# Patient Record
Sex: Female | Born: 1995 | Race: White | Hispanic: No | Marital: Single | Smoking: Never smoker
Health system: Southern US, Community
[De-identification: ages and names within clinical notes are randomized; demographics above are authoritative.]

## PROBLEM LIST (undated history)

## (undated) DIAGNOSIS — D241 Benign neoplasm of right breast: Secondary | ICD-10-CM

## (undated) DIAGNOSIS — F429 Obsessive-compulsive disorder, unspecified: Secondary | ICD-10-CM

## (undated) DIAGNOSIS — F32A Depression, unspecified: Secondary | ICD-10-CM

## (undated) DIAGNOSIS — L309 Dermatitis, unspecified: Secondary | ICD-10-CM

## (undated) DIAGNOSIS — F419 Anxiety disorder, unspecified: Secondary | ICD-10-CM

## (undated) HISTORY — PX: WISDOM TOOTH EXTRACTION: SHX21

---
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
## 2017-03-04 ENCOUNTER — Other Ambulatory Visit: Payer: Self-pay | Admitting: Internal Medicine

## 2017-03-04 DIAGNOSIS — N6310 Unspecified lump in the right breast, unspecified quadrant: Secondary | ICD-10-CM

## 2017-03-10 ENCOUNTER — Other Ambulatory Visit: Payer: Self-pay | Admitting: Internal Medicine

## 2017-03-10 ENCOUNTER — Ambulatory Visit
Admission: RE | Admit: 2017-03-10 | Discharge: 2017-03-10 | Disposition: A | Discharge status: Home / Self Care | Payer: 59 | Source: Ambulatory Visit | Attending: Internal Medicine | Admitting: Internal Medicine

## 2017-03-10 DIAGNOSIS — N6310 Unspecified lump in the right breast, unspecified quadrant: Secondary | ICD-10-CM

## 2017-07-21 ENCOUNTER — Other Ambulatory Visit: Payer: Self-pay | Admitting: Internal Medicine

## 2017-07-21 ENCOUNTER — Ambulatory Visit
Admission: RE | Admit: 2017-07-21 | Discharge: 2017-07-21 | Disposition: A | Discharge status: Home / Self Care | Payer: 59 | Source: Ambulatory Visit | Attending: Internal Medicine | Admitting: Internal Medicine

## 2017-07-21 DIAGNOSIS — R059 Cough, unspecified: Secondary | ICD-10-CM

## 2017-07-21 DIAGNOSIS — R05 Cough: Secondary | ICD-10-CM

## 2018-01-03 ENCOUNTER — Other Ambulatory Visit: Payer: Self-pay | Admitting: Internal Medicine

## 2018-01-03 DIAGNOSIS — N6310 Unspecified lump in the right breast, unspecified quadrant: Secondary | ICD-10-CM

## 2018-01-17 ENCOUNTER — Other Ambulatory Visit: Payer: Self-pay | Admitting: Internal Medicine

## 2018-01-17 ENCOUNTER — Ambulatory Visit
Admission: RE | Admit: 2018-01-17 | Discharge: 2018-01-17 | Disposition: A | Discharge status: Home / Self Care | Payer: 59 | Source: Ambulatory Visit | Attending: Internal Medicine | Admitting: Internal Medicine

## 2018-01-17 DIAGNOSIS — N6310 Unspecified lump in the right breast, unspecified quadrant: Secondary | ICD-10-CM

## 2018-01-24 ENCOUNTER — Ambulatory Visit
Admission: RE | Admit: 2018-01-24 | Discharge: 2018-01-24 | Disposition: A | Discharge status: Home / Self Care | Payer: 59 | Source: Ambulatory Visit | Attending: Internal Medicine | Admitting: Internal Medicine

## 2018-01-24 DIAGNOSIS — N6310 Unspecified lump in the right breast, unspecified quadrant: Secondary | ICD-10-CM

## 2018-04-25 IMAGING — US ULTRASOUND RIGHT BREAST LIMITED
1 series · 5 of 5 positions shown · non-contrast
Comparison: Baseline evaluation

CLINICAL DATA: Palpable abnormality in the lower inner quadrant of
the right breast, 1st noted 1 month ago.

EXAM:
ULTRASOUND OF THE RIGHT BREAST

[Series 1: ultrasound right breast limited · 0.06mm/px · 5 of 5 slices shown]
[im 1/5]
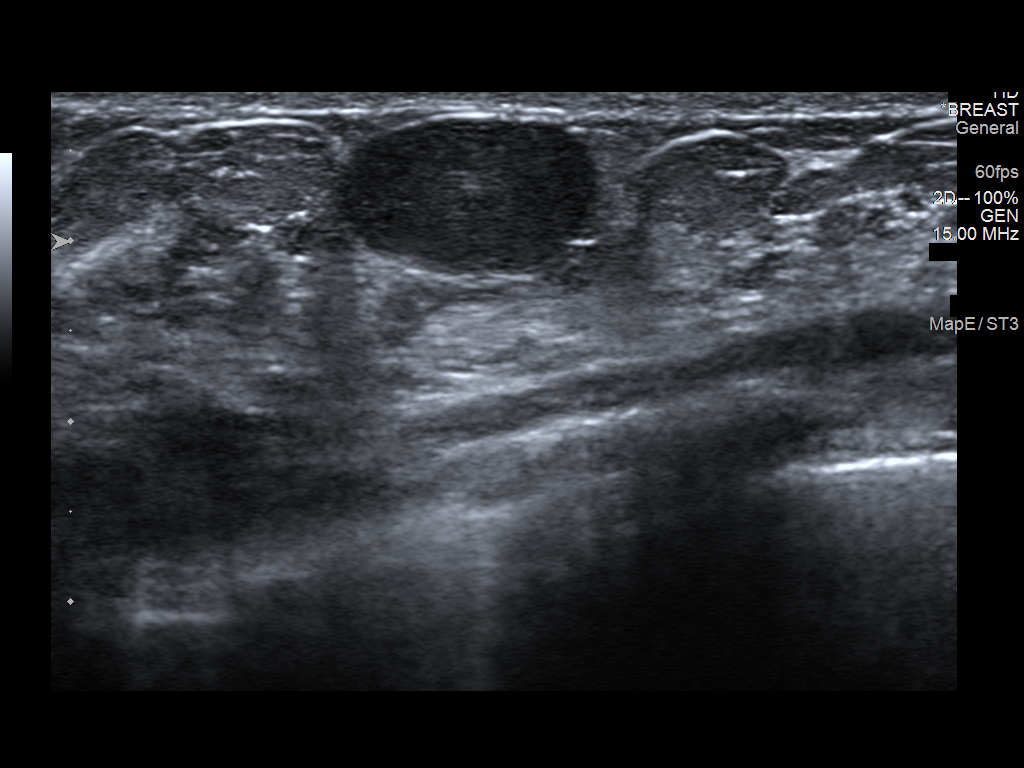
[im 2/5]
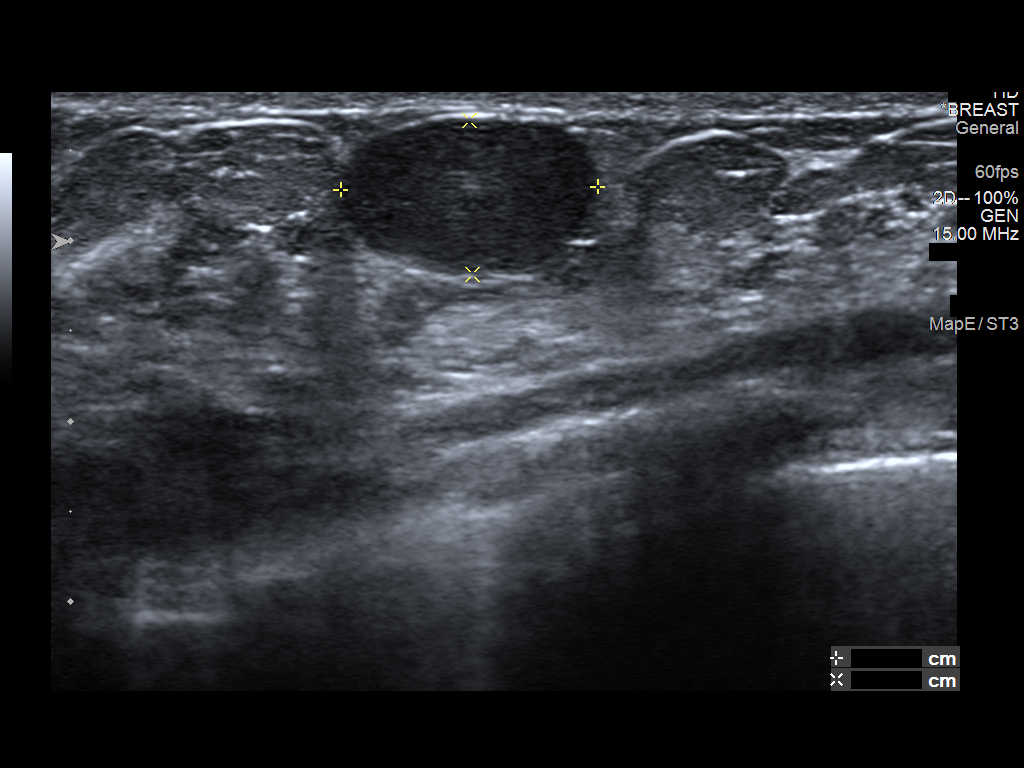
[im 3/5]
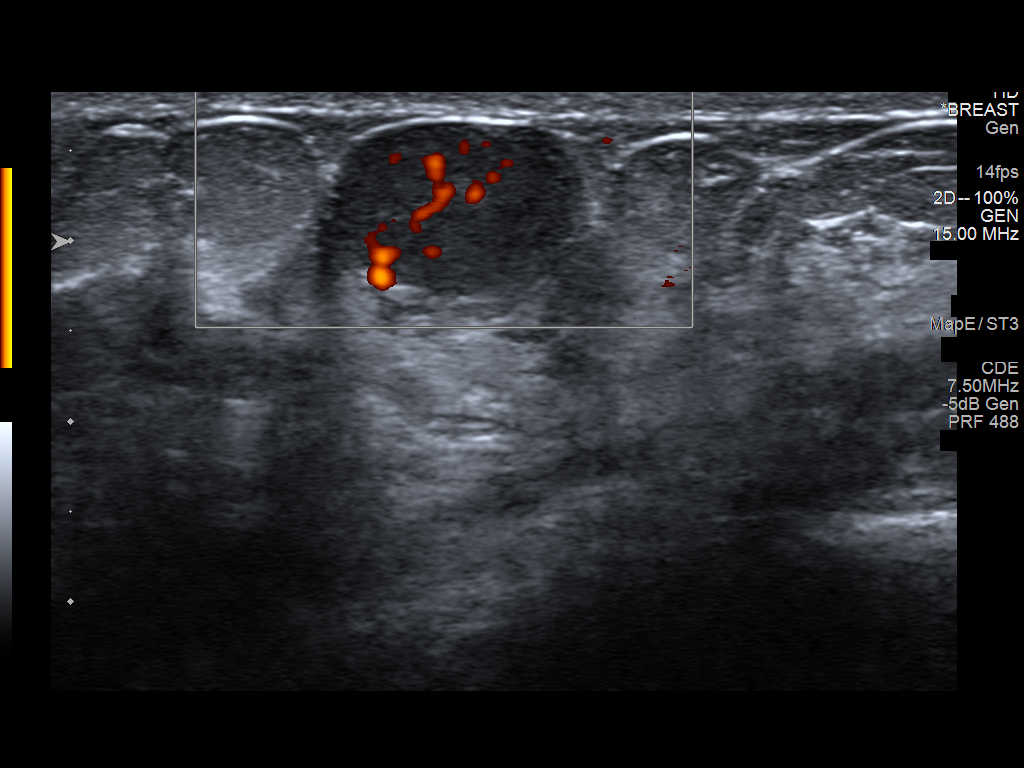
[im 4/5]
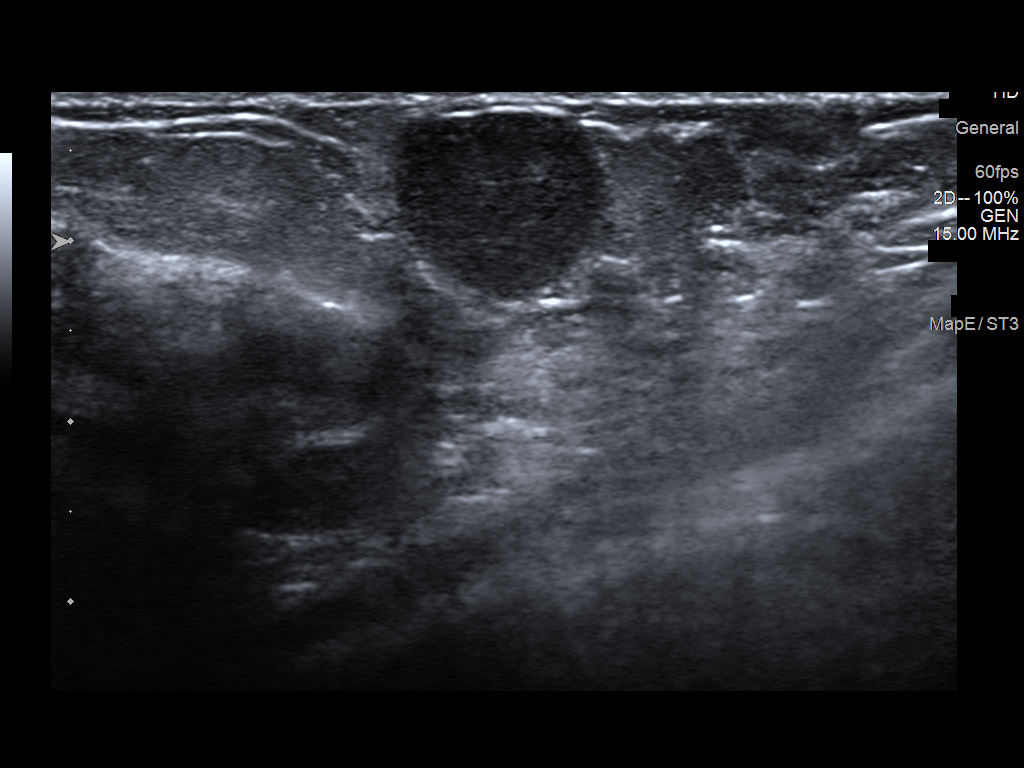
[im 5/5]
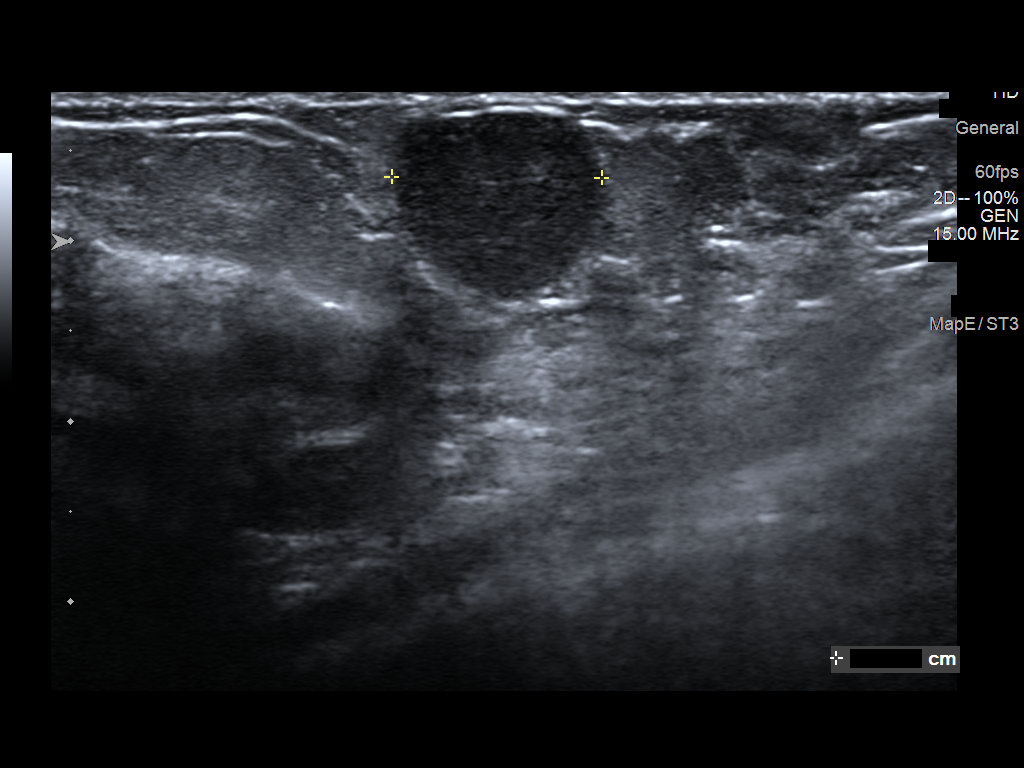

[5 of 5 positions shown; findings below may reference images not displayed]

FINDINGS: On physical exam, I palpate a rounded mobile mass in the lower inner
quadrant of the right breast.

Targeted ultrasound is performed, showing an oval parallel
circumscribed hypoechoic mass with posterior acoustic enhancement in
the 5 o'clock location of the right breast 4 centimeters from the
nipple. Mass is 1.4 x 0.9 x 1.2 centimeters and is associated
internal blood flow.
IMPRESSION: Findings are consistent with benign fibroadenoma. We discussed
management options including excision, biopsy, and close follow-up.
Imaging followup is recommended at 6, 12, and 24 months to assess
stability. The patient concurs with this plan.

RECOMMENDATION:
Right breast ultrasound is recommended in 6 months.

I have discussed the findings and recommendations with the patient.
Results were also provided in writing at the conclusion of the
visit. If applicable, a reminder letter will be sent to the patient
regarding the next appointment.

BI-RADS CATEGORY  3: Probably benign.

## 2018-10-14 ENCOUNTER — Other Ambulatory Visit: Payer: Self-pay

## 2018-10-14 DIAGNOSIS — Z20822 Contact with and (suspected) exposure to covid-19: Secondary | ICD-10-CM

## 2018-10-16 LAB — NOVEL CORONAVIRUS, NAA: SARS-CoV-2, NAA: NOT DETECTED

## 2019-03-04 IMAGING — US ULTRASOUND RIGHT BREAST LIMITED
1 series · 7 of 7 positions shown · non-contrast
Comparison: 03/10/2017

CLINICAL DATA: First six-month follow-up for probably benign mass
in the RIGHT breast. The patient believes that mass may be a little
larger.

EXAM:
ULTRASOUND OF THE RIGHT BREAST

[Series 1: ultrasound right breast limited · 0.06mm/px · 7 of 7 slices shown]
[im 1/7]
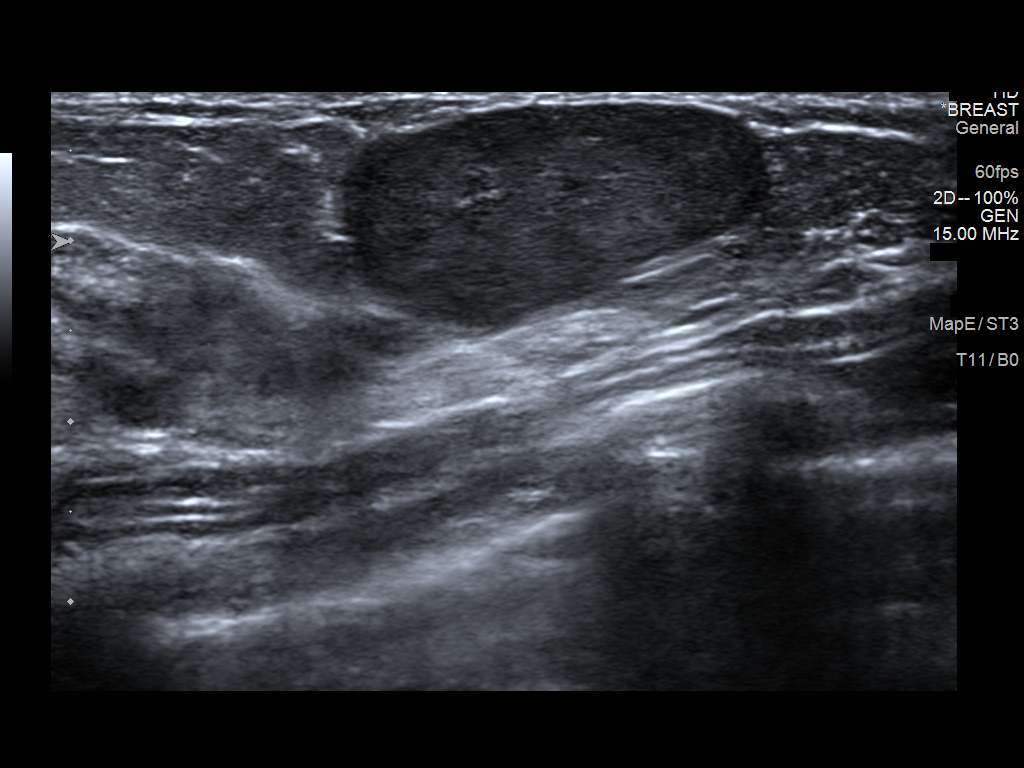
[im 2/7]
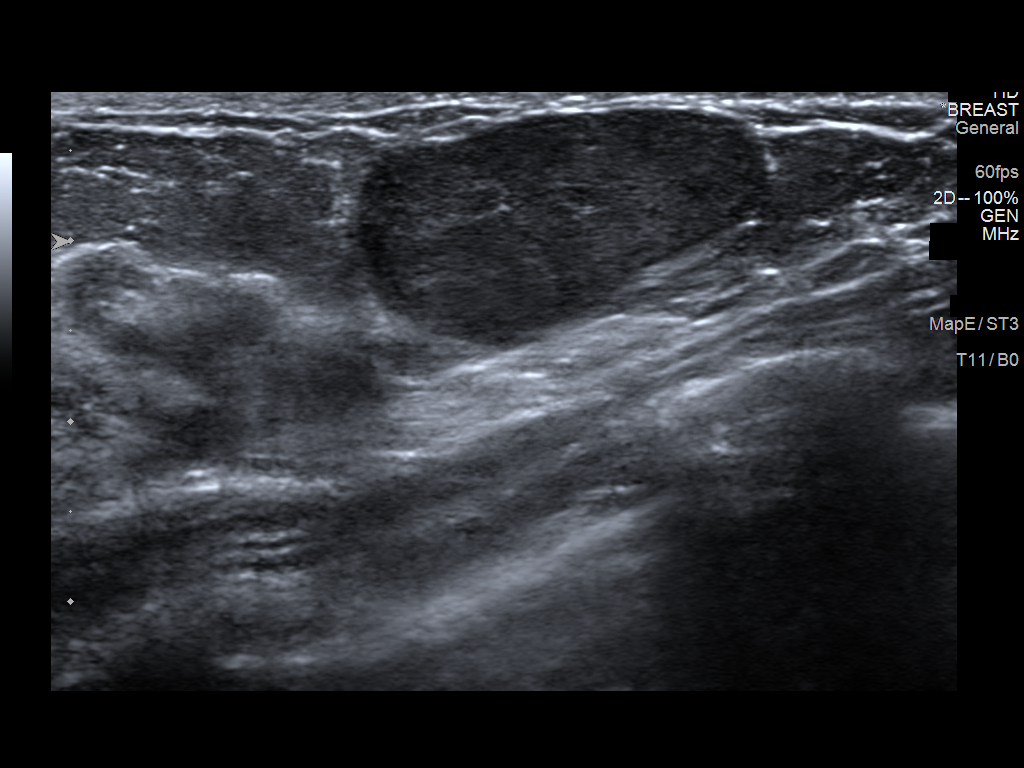
[im 3/7]
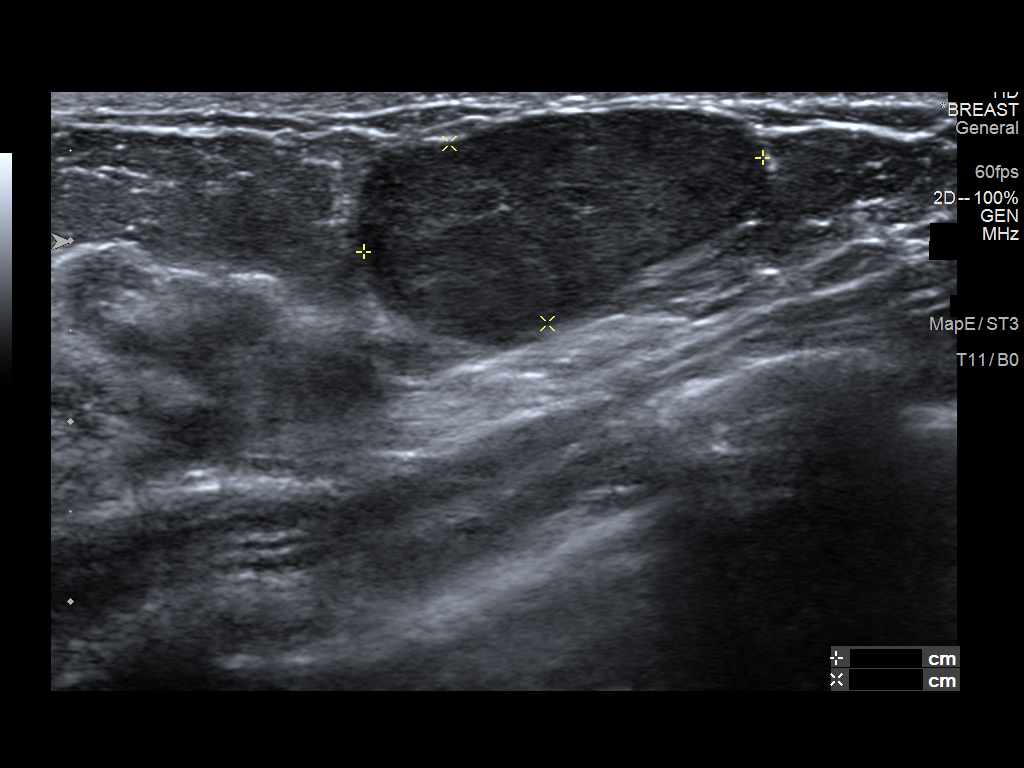
[im 4/7]
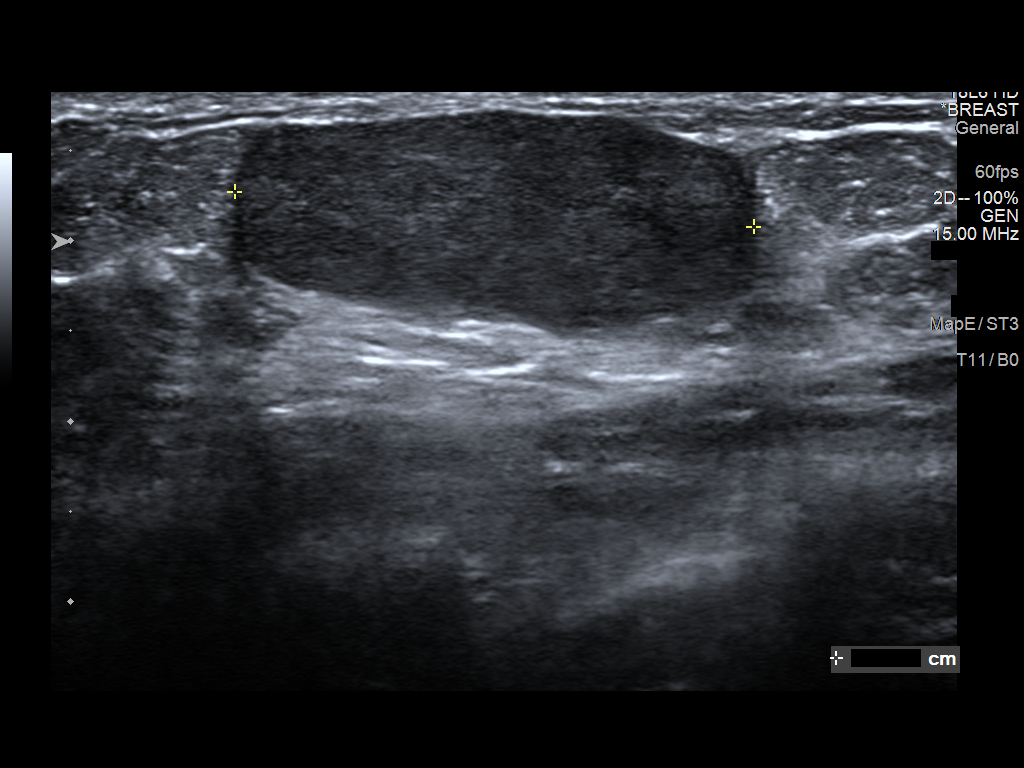
[im 5/7]
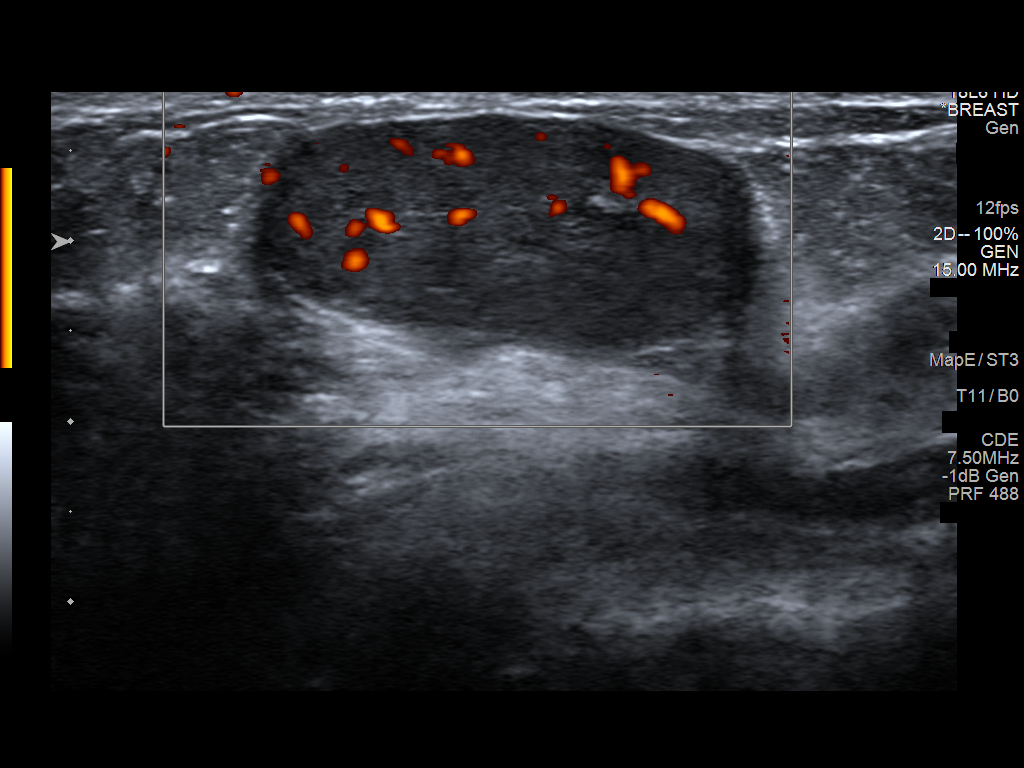
[im 6/7]
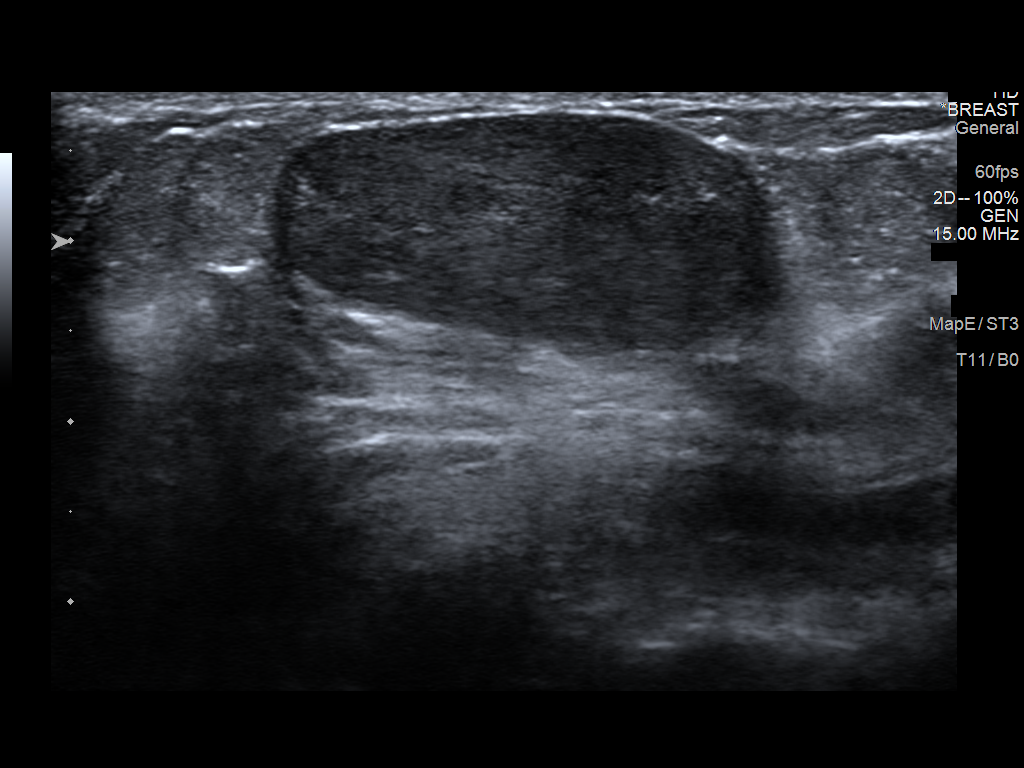
[im 7/7]
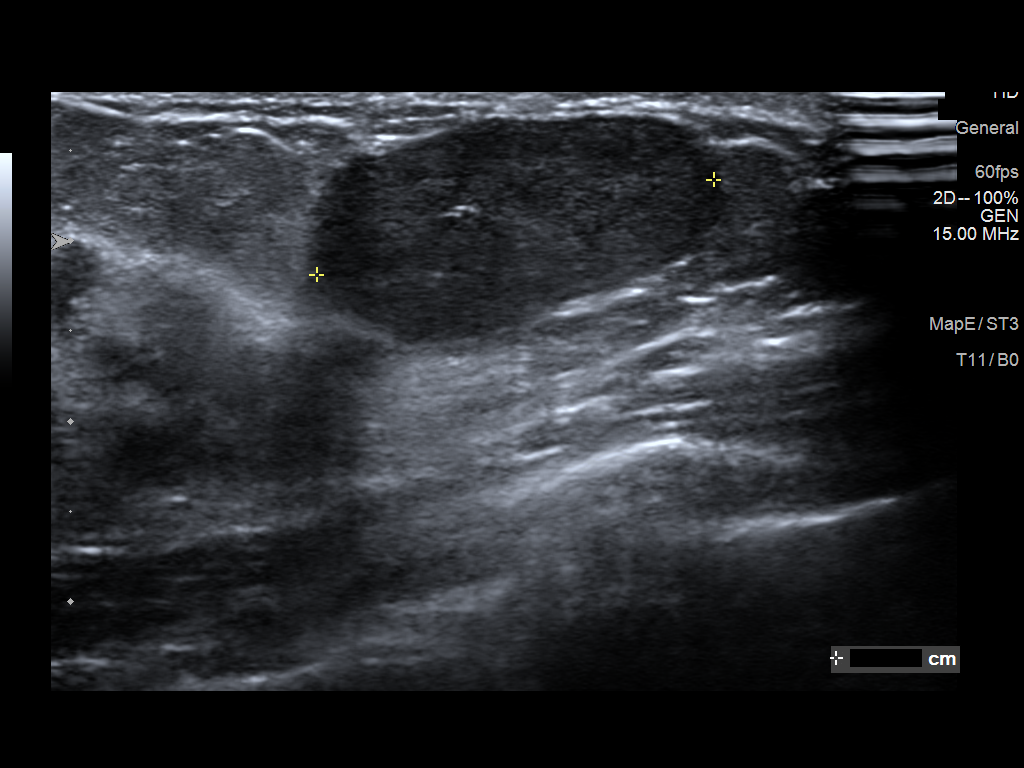

[7 of 7 positions shown; findings below may reference images not displayed]

FINDINGS: On physical exam, I palpate a rounded mobile mass in the LOWER INNER
QUADRANT of the RIGHT breast.

Targeted ultrasound is performed, showing a circumscribed oval
hypoechoic mass with posterior acoustic enhancement and presence of
internal blood flow in the 5 o'clock location of the RIGHT breast 4
centimeters from the nipple which measures 2.9 x 2.3 x
centimeters. Previously mass measured 1.2 x 1.4 x 0.9 centimeters.
IMPRESSION: Enlargement of RIGHT breast mass.

RECOMMENDATION:
Recommend ultrasound-guided core biopsy.

I have discussed the findings and recommendations with the patient
and her mother. Results were also provided in writing at the
conclusion of the visit. If applicable, a reminder letter will be
sent to the patient regarding the next appointment.

BI-RADS CATEGORY  4: Suspicious.

## 2019-03-11 IMAGING — US US BREAST BX W LOC DEV 1ST LESION IMG BX SPEC US GUIDE*R*
1 series · 7 of 7 positions shown · non-contrast
Comparison: Previous exam(s).

Addendum:
CLINICAL DATA: Status post ultrasound-guided core biopsy of a mass
in the 5 o'clock region of the right breast.

EXAM:
ULTRASOUND GUIDED RIGHT BREAST CORE NEEDLE BIOPSY

[Series 1: us breast bx w loc dev 1st lesion img bx spec us g · 0.06mm/px · 7 of 7 slices shown]
[im 1/7]
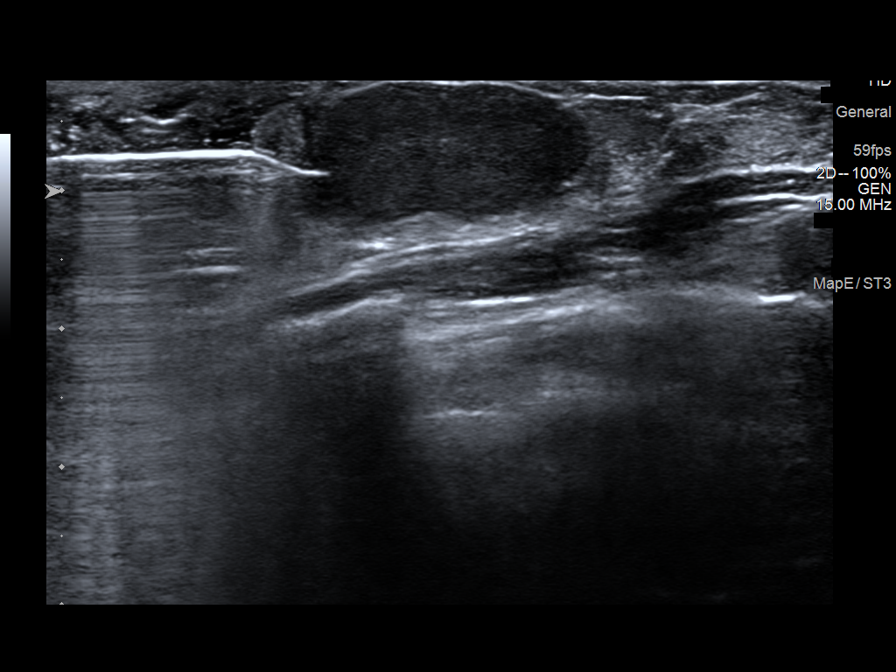
[im 2/7]
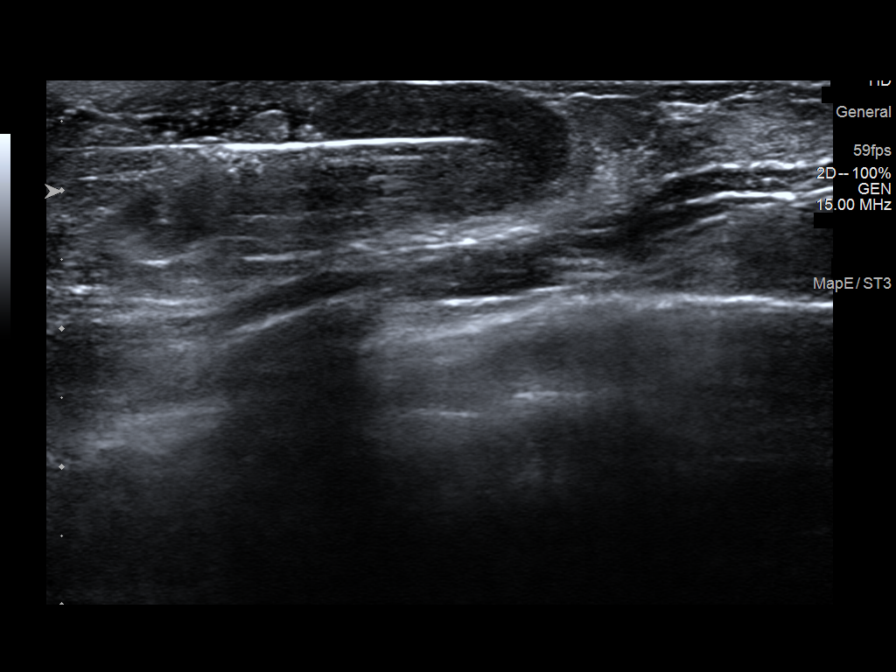
[im 3/7]
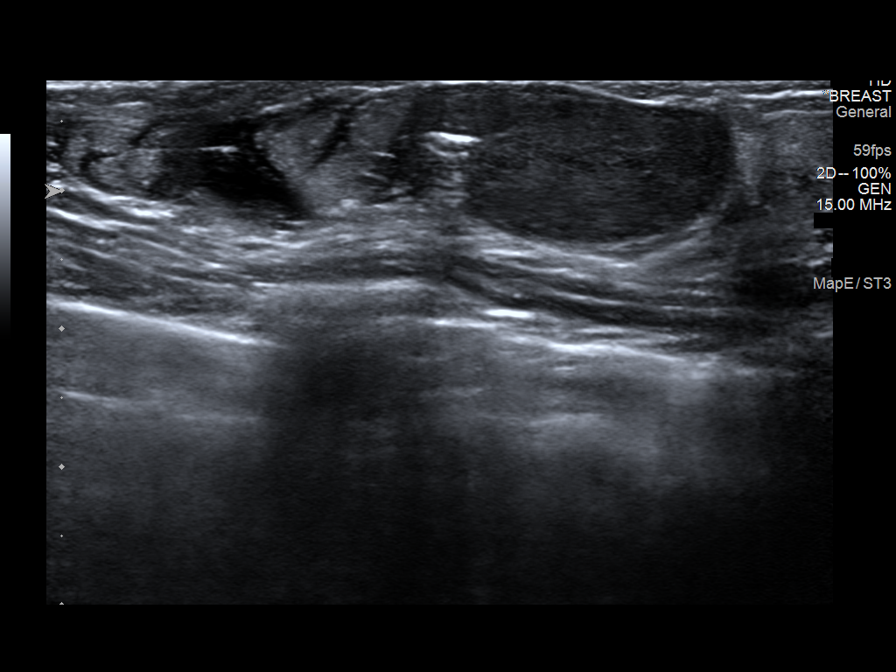
[im 4/7]
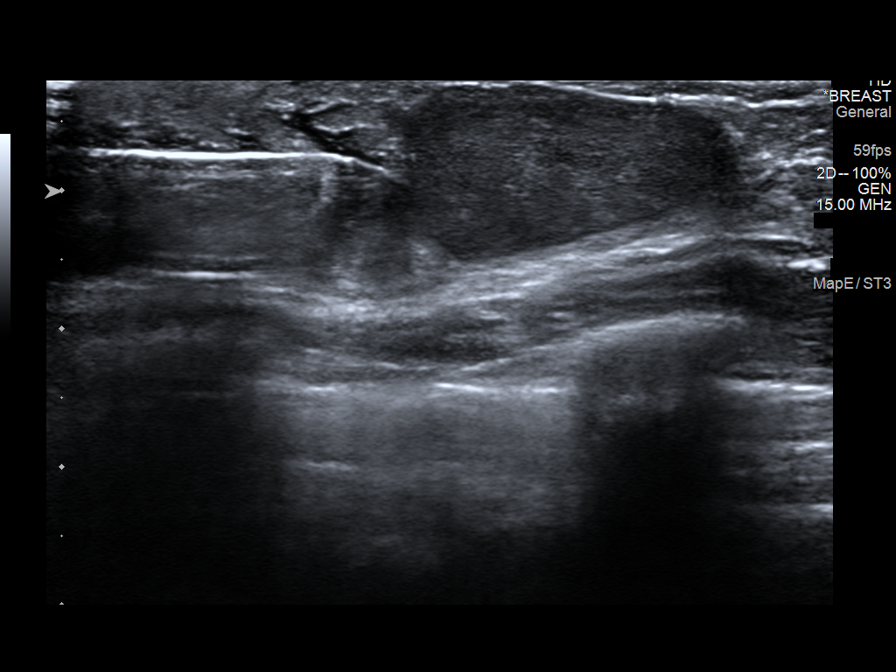
[im 5/7]
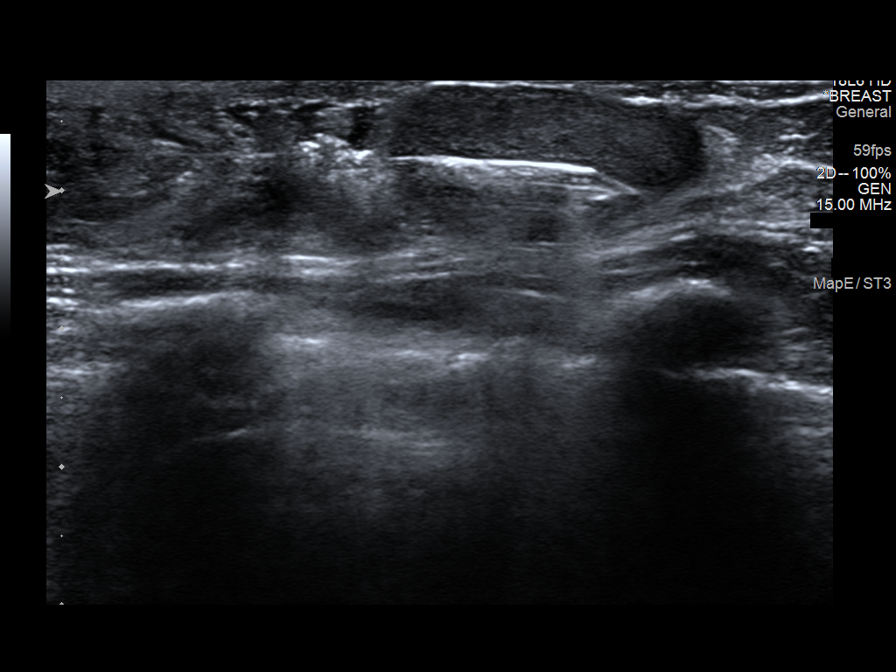
[im 6/7]
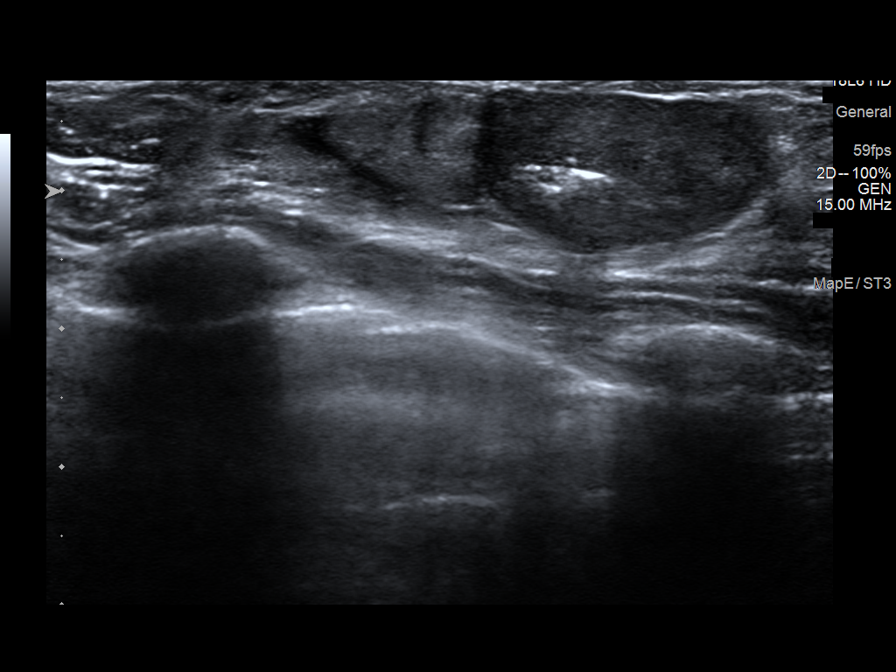
[im 7/7]
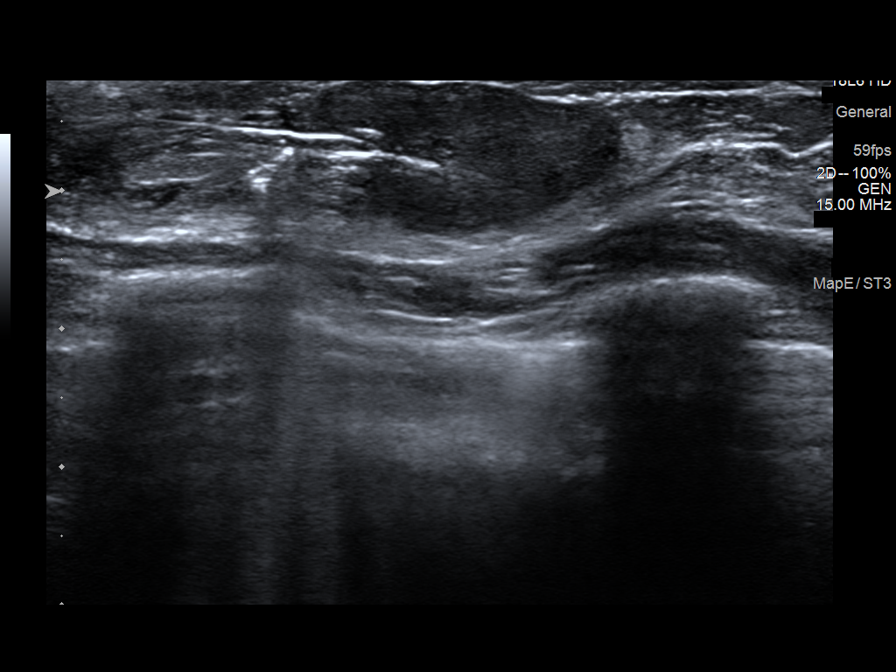

[7 of 7 positions shown; findings below may reference images not displayed]



Lesion quadrant: Lower inner quadrant

Using sterile technique and 1% lidocaine 1% lidocaine with
epinephrine as local anesthetic, under direct ultrasound
visualization, a 14 gauge [place] device was used to perform
biopsy of a mass in the 5 o'clock region of the right breast using a
lateral to medial approach. At the conclusion of the procedure a
HydroMARK tissue marker clip was deployed into the biopsy cavity.
IMPRESSION: Ultrasound guided biopsy of the right breast. No apparent
complications.

ADDENDUM:
Pathology revealed FIBROADENOMA of the Right breast, 5 o'clock. This
was found to be concordant by Dr. Esengali Efimova.

Pathology results were discussed with the patient by telephone. The
patient reported doing well after the biopsy with tenderness at the
site. Post biopsy instructions and care were reviewed and questions
were answered. The patient was encouraged to call The [REDACTED]

The patient was instructed to continue with monthly self breast
examinations, clinical follow-up as needed, and to return for annual
mammography at 41.

Pathology results reported by Anders Christian Seegert, RN on 01/25/2018.

*** End of Addendum ***

## 2019-06-29 ENCOUNTER — Ambulatory Visit: Payer: 59 | Attending: Internal Medicine

## 2019-06-29 DIAGNOSIS — Z23 Encounter for immunization: Secondary | ICD-10-CM

## 2019-06-29 NOTE — Progress Notes (Signed)
   Covid-19 Vaccination Clinic  Name:  Gabriela Warner    MRN: [id number] DOB: [birth year]  06/29/2019  Gabriela Warner was observed post Covid-19 immunization for 15 minutes without incident. She was provided with Vaccine Information Sheet and instruction to access the V-Safe system.   Gabriela Warner was instructed to call 911 with any severe reactions post vaccine: Marland Kitchen Difficulty breathing  . Swelling of face and throat  . A fast heartbeat  . A bad rash all over body  . Dizziness and weakness   Immunizations Administered    Name Date Dose VIS Date Route   Pfizer COVID-19 Vaccine 06/29/2019  8:59 AM 0.3 mL 04/26/2018 Intramuscular   Manufacturer: [place]   Lot: [id number]   [place]: [id number]

## 2019-07-24 ENCOUNTER — Ambulatory Visit: Payer: 59 | Attending: Internal Medicine

## 2019-07-26 ENCOUNTER — Ambulatory Visit: Payer: 59 | Attending: Internal Medicine

## 2019-07-26 DIAGNOSIS — Z23 Encounter for immunization: Secondary | ICD-10-CM

## 2019-07-26 NOTE — Progress Notes (Signed)
   Covid-19 Vaccination Clinic  Name:  Gabriela Warner    MRN: [id number] DOB: Oct 07, 1995  07/26/2019  Gabriela Warner was observed post Covid-19 immunization for 15 minutes without incident. She was provided with Vaccine Information Sheet and instruction to access the V-Safe system.   Gabriela Warner was instructed to call 911 with any severe reactions post vaccine: Marland Kitchen Difficulty breathing  . Swelling of face and throat  . A fast heartbeat  . A bad rash all over body  . Dizziness and weakness   Immunizations Administered    Name Date Dose VIS Date Route   Pfizer COVID-19 Vaccine 07/26/2019  8:17 AM 0.3 mL 04/26/2018 Intramuscular   Manufacturer: [employer], [employer]   Lot: [id number]   [place]: [id number]

## 2019-08-18 ENCOUNTER — Other Ambulatory Visit: Payer: Self-pay

## 2019-08-18 ENCOUNTER — Encounter (HOSPITAL_BASED_OUTPATIENT_CLINIC_OR_DEPARTMENT_OTHER): Payer: Self-pay | Admitting: Surgery

## 2019-08-22 ENCOUNTER — Other Ambulatory Visit (HOSPITAL_COMMUNITY)
Admission: RE | Admit: 2019-08-22 | Discharge: 2019-08-22 | Disposition: A | Discharge status: Home / Self Care | Payer: 59 | Source: Ambulatory Visit | Attending: Surgery | Admitting: Surgery

## 2019-08-22 DIAGNOSIS — Z01812 Encounter for preprocedural laboratory examination: Secondary | ICD-10-CM | POA: Diagnosis present

## 2019-08-22 DIAGNOSIS — Z20822 Contact with and (suspected) exposure to covid-19: Secondary | ICD-10-CM | POA: Insufficient documentation

## 2019-08-22 LAB — SARS CORONAVIRUS 2 (TAT 6-24 HRS): SARS Coronavirus 2: NEGATIVE

## 2019-08-23 ENCOUNTER — Ambulatory Visit: Payer: Self-pay | Admitting: Surgery

## 2019-08-23 NOTE — H&P (View-Only) (Signed)
Gabriela Warner Documented: 06/21/2019 2:39 PM Location: [place] Office Patient #: [id number] DOB: 1995/09/13 Single / Language: Gabriela Warner / Race: White Female   History of Present Illness Rodman Key B. Hassell Done MD; 06/21/2019 3:19 PM) The patient is a 24 year old female who presents with a breast mass. The patient is being seen for initial consultation for suspected neoplasm in the right breast (5 oclock). Core biopsy confirms fibroadenoma. No malignancy suspected but it has been enlarging and is more tender.    Allergies Malachi Bonds, CMA; 06/21/2019 2:44 PM) No Known Drug Allergies  [10/28/2017]:  Medication History Malachi Bonds, CMA; 06/21/2019 2:46 PM) ClonazePAM (0.5MG  Tablet, Oral) Active. Vyvanse (30MG  Capsule, Oral) Active. Clarinex (5MG  Tablet, Oral) Active. Alclometasone Dipropionate (0.05% Cream, External) Active. Venlafaxine HCl ER (37.5MG  Capsule ER 24HR, Oral) Active. Desoximetasone (0.05% Ointment, External) Active. Medications Reconciled  Vitals (Chemira Jones CMA; 06/21/2019 2:44 PM) 06/21/2019 2:44 PM Weight: 129 lb Height: 61in Body Surface Area: 1.57 m Body Mass Index: 24.37 kg/m  Pulse: 118 (Regular)  BP: 120/70(Sitting, Left Arm, Standard)       Physical Exam (Orean Giarratano B. Hassell Done MD; 06/21/2019 3:24 PM) Breast--mass at 4 oclock position of the right breast    Cardiovascular Note: SR without murmurs     Assessment & Plan Rodman Key B. Hassell Done MD; 06/21/2019 3:23 PM) BREAST FIBROADENOMA IN FEMALE, RIGHT (D24.1) Impression: Fibroadenoma of the right breast. Plan excision. I have discussed with the patient and her mother who want to proceed with removal.

## 2019-08-23 NOTE — H&P (Signed)
Gabriela Warner Documented: 06/21/2019 2:39 PM Location: [place] [facility] Patient #: [id number] DOB: 11-22-1995 Single / Language: Gabriela Warner / Race: White Female   History of Present Illness Rodman Key B. Hassell Done MD; 06/21/2019 3:19 PM) The patient is a 24 year old female who presents with a breast mass. The patient is being seen for initial consultation for suspected neoplasm in the right breast (5 oclock). Core biopsy confirms fibroadenoma. No malignancy suspected but it has been enlarging and is more tender.    Allergies Malachi Bonds, CMA; 06/21/2019 2:44 PM) No Known Drug Allergies  [10/28/2017]:  Medication History Malachi Bonds, CMA; 06/21/2019 2:46 PM) ClonazePAM (0.5MG  Tablet, Oral) Active. Vyvanse (30MG  Capsule, Oral) Active. Clarinex (5MG  Tablet, Oral) Active. Alclometasone Dipropionate (0.05% Cream, External) Active. Venlafaxine HCl ER (37.5MG  Capsule ER 24HR, Oral) Active. Desoximetasone (0.05% Ointment, External) Active. Medications Reconciled  Vitals (Chemira Jones CMA; 06/21/2019 2:44 PM) 06/21/2019 2:44 PM Weight: 129 lb Height: 61in Body Surface Area: 1.57 m Body Mass Index: 24.37 kg/m  Pulse: 118 (Regular)  BP: 120/70(Sitting, Left Arm, Standard)       Physical Exam (Daegen Berrocal B. Hassell Done MD; 06/21/2019 3:24 PM) Breast--mass at 4 oclock position of the right breast    Cardiovascular Note: SR without murmurs     Assessment & Plan Rodman Key B. Hassell Done MD; 06/21/2019 3:23 PM) BREAST FIBROADENOMA IN FEMALE, RIGHT (D24.1) Impression: Fibroadenoma of the right breast. Plan excision. I have discussed with the patient and her mother who want to proceed with removal.

## 2019-08-25 ENCOUNTER — Other Ambulatory Visit: Payer: Self-pay

## 2019-08-25 ENCOUNTER — Encounter (HOSPITAL_BASED_OUTPATIENT_CLINIC_OR_DEPARTMENT_OTHER)
Admission: RE | Disposition: A | Discharge status: Home / Self Care | Payer: Self-pay | Source: Home / Self Care | Attending: Surgery

## 2019-08-25 ENCOUNTER — Ambulatory Visit (HOSPITAL_BASED_OUTPATIENT_CLINIC_OR_DEPARTMENT_OTHER): Payer: 59 | Admitting: Certified Registered"

## 2019-08-25 ENCOUNTER — Encounter (HOSPITAL_BASED_OUTPATIENT_CLINIC_OR_DEPARTMENT_OTHER): Payer: Self-pay | Admitting: Surgery

## 2019-08-25 ENCOUNTER — Ambulatory Visit (HOSPITAL_BASED_OUTPATIENT_CLINIC_OR_DEPARTMENT_OTHER)
Admission: RE | Admit: 2019-08-25 | Discharge: 2019-08-25 | Disposition: A | Discharge status: Home / Self Care | Payer: 59 | Attending: Surgery | Admitting: Surgery

## 2019-08-25 DIAGNOSIS — Z79899 Other long term (current) drug therapy: Secondary | ICD-10-CM | POA: Diagnosis not present

## 2019-08-25 DIAGNOSIS — F419 Anxiety disorder, unspecified: Secondary | ICD-10-CM | POA: Diagnosis not present

## 2019-08-25 DIAGNOSIS — F329 Major depressive disorder, single episode, unspecified: Secondary | ICD-10-CM | POA: Insufficient documentation

## 2019-08-25 DIAGNOSIS — D241 Benign neoplasm of right breast: Secondary | ICD-10-CM

## 2019-08-25 HISTORY — PX: BREAST BIOPSY: SHX20

## 2019-08-25 HISTORY — DX: Benign neoplasm of right breast: D24.1

## 2019-08-25 HISTORY — DX: Depression, unspecified: F32.A

## 2019-08-25 HISTORY — DX: Dermatitis, unspecified: L30.9

## 2019-08-25 HISTORY — DX: Anxiety disorder, unspecified: F41.9

## 2019-08-25 HISTORY — DX: Obsessive-compulsive disorder, unspecified: F42.9

## 2019-08-25 SURGERY — BREAST BIOPSY
Anesthesia: General | Site: Breast | Laterality: Right

## 2019-08-25 MED ORDER — HYDROMORPHONE HCL 1 MG/ML IJ SOLN: 0.2500 mg | INTRAMUSCULAR | Status: DC | PRN

## 2019-08-25 MED ORDER — PROPOFOL 10 MG/ML IV BOLUS
INTRAVENOUS | Status: AC
  Filled 2019-08-25: qty 20

## 2019-08-25 MED ORDER — MIDAZOLAM HCL 2 MG/2ML IJ SOLN
INTRAMUSCULAR | Status: AC
  Filled 2019-08-25: qty 2

## 2019-08-25 MED ORDER — BUPIVACAINE HCL (PF) 0.5 % IJ SOLN
INTRAMUSCULAR | Status: DC | PRN
  Administered 2019-08-25: 10 mL

## 2019-08-25 MED ORDER — MIDAZOLAM HCL 5 MG/5ML IJ SOLN
INTRAMUSCULAR | Status: DC | PRN
  Administered 2019-08-25: 2 mg via INTRAVENOUS

## 2019-08-25 MED ORDER — ACETAMINOPHEN 500 MG PO TABS
1000.0000 mg | ORAL_TABLET | ORAL | Status: AC
  Administered 2019-08-25: 1000 mg via ORAL

## 2019-08-25 MED ORDER — CEFAZOLIN SODIUM-DEXTROSE 2-4 GM/100ML-% IV SOLN
2.0000 g | INTRAVENOUS | Status: AC
  Administered 2019-08-25: 2 g via INTRAVENOUS

## 2019-08-25 MED ORDER — HYDROCODONE-ACETAMINOPHEN 5-325 MG PO TABS
1.0000 | ORAL_TABLET | Freq: Four times a day (QID) | ORAL | 0 refills | Status: AC | PRN
Start: 2019-08-25 — End: ?

## 2019-08-25 MED ORDER — ONDANSETRON HCL 4 MG/2ML IJ SOLN
INTRAMUSCULAR | Status: DC | PRN
  Administered 2019-08-25: 4 mg via INTRAVENOUS

## 2019-08-25 MED ORDER — PROPOFOL 10 MG/ML IV BOLUS
INTRAVENOUS | Status: DC | PRN
  Administered 2019-08-25: 50 mg via INTRAVENOUS
  Administered 2019-08-25: 150 mg via INTRAVENOUS

## 2019-08-25 MED ORDER — CHLORHEXIDINE GLUCONATE CLOTH 2 % EX PADS: 6.0000 | MEDICATED_PAD | Freq: Once | CUTANEOUS | Status: DC

## 2019-08-25 MED ORDER — ACETAMINOPHEN 500 MG PO TABS
ORAL_TABLET | ORAL | Status: AC
  Filled 2019-08-25: qty 1

## 2019-08-25 MED ORDER — DEXAMETHASONE SODIUM PHOSPHATE 10 MG/ML IJ SOLN
INTRAMUSCULAR | Status: DC | PRN
  Administered 2019-08-25: 4 mg via INTRAVENOUS

## 2019-08-25 MED ORDER — FENTANYL CITRATE (PF) 100 MCG/2ML IJ SOLN
INTRAMUSCULAR | Status: DC | PRN
  Administered 2019-08-25: 25 ug via INTRAVENOUS

## 2019-08-25 MED ORDER — SCOPOLAMINE 1 MG/3DAYS TD PT72
MEDICATED_PATCH | TRANSDERMAL | Status: AC
  Filled 2019-08-25: qty 1

## 2019-08-25 MED ORDER — SCOPOLAMINE 1 MG/3DAYS TD PT72
1.0000 | MEDICATED_PATCH | TRANSDERMAL | Status: DC
  Administered 2019-08-25: 1.5 mg via TRANSDERMAL

## 2019-08-25 MED ORDER — LACTATED RINGERS IV SOLN: INTRAVENOUS | Status: DC

## 2019-08-25 MED ORDER — BUPIVACAINE HCL (PF) 0.5 % IJ SOLN
INTRAMUSCULAR | Status: AC
  Filled 2019-08-25: qty 30

## 2019-08-25 MED ORDER — CEFAZOLIN SODIUM-DEXTROSE 2-4 GM/100ML-% IV SOLN
INTRAVENOUS | Status: AC
  Filled 2019-08-25: qty 100

## 2019-08-25 MED ORDER — LIDOCAINE HCL (CARDIAC) PF 100 MG/5ML IV SOSY
PREFILLED_SYRINGE | INTRAVENOUS | Status: DC | PRN
  Administered 2019-08-25: 60 mg via INTRAVENOUS

## 2019-08-25 MED ORDER — FENTANYL CITRATE (PF) 100 MCG/2ML IJ SOLN
INTRAMUSCULAR | Status: AC
  Filled 2019-08-25: qty 2

## 2019-08-25 SURGICAL SUPPLY — 45 items
ADH SKN CLS APL DERMABOND .7 (GAUZE/BANDAGES/DRESSINGS) ×1
APL SKNCLS STERI-STRIP NONHPOA (GAUZE/BANDAGES/DRESSINGS)
BENZOIN TINCTURE PRP APPL 2/3 (GAUZE/BANDAGES/DRESSINGS) IMPLANT
BLADE SURG 15 STRL LF DISP TIS (BLADE) ×1 IMPLANT
BLADE SURG 15 STRL SS (BLADE) ×3
BNDG ELASTIC 6X5.8 VLCR STR LF (GAUZE/BANDAGES/DRESSINGS) IMPLANT
CANISTER SUCT 1200ML W/VALVE (MISCELLANEOUS) ×3 IMPLANT
CLOSURE WOUND 1/2 X4 (GAUZE/BANDAGES/DRESSINGS)
COVER BACK TABLE 60X90IN (DRAPES) ×3 IMPLANT
COVER MAYO STAND STRL (DRAPES) ×3 IMPLANT
COVER WAND RF STERILE (DRAPES) IMPLANT
DECANTER SPIKE VIAL GLASS SM (MISCELLANEOUS) IMPLANT
DERMABOND ADVANCED (GAUZE/BANDAGES/DRESSINGS) ×2
DERMABOND ADVANCED .7 DNX12 (GAUZE/BANDAGES/DRESSINGS) IMPLANT
DRAPE LAPAROTOMY 100X72 PEDS (DRAPES) ×3 IMPLANT
ELECT COATED BLADE 2.86 ST (ELECTRODE) ×3 IMPLANT
ELECT REM PT RETURN 9FT ADLT (ELECTROSURGICAL) ×3
ELECTRODE REM PT RTRN 9FT ADLT (ELECTROSURGICAL) ×1 IMPLANT
GLOVE BIO SURGEON STRL SZ8 (GLOVE) ×3 IMPLANT
GOWN STRL REUS W/ TWL LRG LVL3 (GOWN DISPOSABLE) ×1 IMPLANT
GOWN STRL REUS W/ TWL XL LVL3 (GOWN DISPOSABLE) ×1 IMPLANT
GOWN STRL REUS W/TWL LRG LVL3 (GOWN DISPOSABLE) ×3
GOWN STRL REUS W/TWL XL LVL3 (GOWN DISPOSABLE) ×3
NDL HYPO 25X1 1.5 SAFETY (NEEDLE) ×1 IMPLANT
NEEDLE HYPO 25X1 1.5 SAFETY (NEEDLE) ×3 IMPLANT
NS IRRIG 1000ML POUR BTL (IV SOLUTION) ×3 IMPLANT
PENCIL SMOKE EVACUATOR (MISCELLANEOUS) ×3 IMPLANT
SET BASIN DAY SURGERY F.S. (CUSTOM PROCEDURE TRAY) ×3 IMPLANT
SLEEVE SCD COMPRESS KNEE MED (MISCELLANEOUS) ×3 IMPLANT
STRIP CLOSURE SKIN 1/2X4 (GAUZE/BANDAGES/DRESSINGS) IMPLANT
SUCTION FRAZIER HANDLE 10FR (MISCELLANEOUS) ×3
SUCTION TUBE FRAZIER 10FR DISP (MISCELLANEOUS) ×1 IMPLANT
SUT MON AB 5-0 P3 18 (SUTURE) ×2 IMPLANT
SUT SILK 2 0 SH (SUTURE) ×3 IMPLANT
SUT VIC AB 4-0 SH 18 (SUTURE) ×3 IMPLANT
SUT VIC AB 5-0 P-3 18X BRD (SUTURE) ×1 IMPLANT
SUT VIC AB 5-0 P3 18 (SUTURE) ×3
SYR BULB EAR ULCER 3OZ GRN STR (SYRINGE) ×3 IMPLANT
SYR CONTROL 10ML LL (SYRINGE) ×3 IMPLANT
TOWEL GREEN STERILE FF (TOWEL DISPOSABLE) ×3 IMPLANT
TRAY DSU PREP LF (CUSTOM PROCEDURE TRAY) ×3 IMPLANT
TRAY FAXITRON CT DISP (TRAY / TRAY PROCEDURE) IMPLANT
TUBE CONNECTING 20'X1/4 (TUBING) ×1
TUBE CONNECTING 20X1/4 (TUBING) ×2 IMPLANT
YANKAUER SUCT BULB TIP NO VENT (SUCTIONS) IMPLANT

## 2019-08-25 NOTE — Anesthesia Postprocedure Evaluation (Signed)
Anesthesia Post Note  Patient: Gabriela Warner  Procedure(s) Performed: RIGHT BREAST BIOPSY (Right Breast)     Patient location during evaluation: PACU Anesthesia Type: General Level of consciousness: awake and alert Pain management: pain level controlled Vital Signs Assessment: post-procedure vital signs reviewed and stable Respiratory status: spontaneous breathing, nonlabored ventilation and respiratory function stable Cardiovascular status: blood pressure returned to baseline and stable Postop Assessment: no apparent nausea or vomiting Anesthetic complications: no   No complications documented.  Last Vitals:  Vitals:   08/25/19 0845 08/25/19 0911  BP: 121/83 132/90  Pulse: (!) 103 99  Resp: 16 18  Temp:  37.1 C  SpO2: 100% 99%    Last Pain:  Vitals:   08/25/19 0911  TempSrc:   PainSc: 0-No pain                 Saraiah Bhat,W. EDMOND

## 2019-08-25 NOTE — Discharge Instructions (Signed)
Breast Biopsy, Care After These instructions give you information about caring for yourself after your procedure. Your doctor may also give you more specific instructions. Call your doctor if you have any problems or questions after your procedure. What can I expect after the procedure? After your procedure, it is common to have:  Bruising on your breast.  Numbness, tingling, or pain near your biopsy site. Follow these instructions at home: Medicines  Take over-the-counter and prescription medicines only as told by your doctor.  Do not drive for 24 hours if you were given a medicine to help you relax (sedative) during your procedure.  Do not drink alcohol while taking pain medicine.  Do not drive or use heavy machinery while taking prescription pain medicine. Biopsy site care      Follow instructions from your doctor about how to take care of your cut from surgery (incision) or your puncture area. Make sure you: ? Wash your hands with soap and water before you change your bandage (dressing). If you cannot use soap and water, use hand sanitizer. ? Change your bandage as told by your doctor. ? Leave stitches (sutures), skin glue, or skin tape (adhesive strips) in place. They may need to stay in place for 2 weeks or longer. If tape strips get loose and curl up, you may trim the loose edges. Do not remove tape strips completely unless your doctor says it is okay.  If you have stitches, keep them dry when you take a bath or a shower.  Check your cut or puncture area every day for signs of infection. Check for: ? Redness, swelling, or pain. ? Fluid or blood. ? Warmth. ? Pus or a bad smell.  Protect the biopsy area. Do not let the area get bumped. Activity  If you had a cut during your procedure, avoid activities that could pull your cut open. These include: ? Stretching. ? Reaching over your head. ? Exercise. ? Sports. ? Lifting anything that weighs more than 3 lb (1.4  kg).  Return to your normal activities as told by your doctor. Ask your doctor what activities are safe for you. Managing pain, stiffness, and swelling If told, put ice on the biopsy site to relieve swelling:  Put ice in a plastic bag.  Place a towel between your skin and the bag.  Leave the ice on for 20 minutes, 2-3 times a day. General instructions  Continue your normal diet.  Wear a good support bra for as long as told by your doctor.  Get checked for extra fluid around your lymph nodes (lymphedema) as often as told by your doctor.  Keep all follow-up visits as told by your doctor. This is important. Contact a doctor if:  You notice any of the following at the biopsy site: ? More redness, swelling, or pain. ? More fluid or blood coming from the site. ? The site feels warm to the touch. ? Pus or a bad smell coming from the site. ? The site breaks open after the stitches or skin tape strips have been removed.  You have a rash.  You have a fever. Get help right away if:  You have more bleeding from the biopsy site. Get help right away if bleeding is more than a small spot.  You have trouble breathing.  You have red streaks around the biopsy site. Summary  After your procedure, it is common to have bruising, numbness, tingling, or pain near the biopsy site.  Do not drive   or use heavy machinery while taking prescription pain medicine.  Wear a good support bra for as long as told by your doctor.  If you had a cut during your procedure, avoid activities that may pull the cut open. Ask your doctor what activities are safe for you. This information is not intended to replace advice given to you by your health care provider. Make sure you discuss any questions you have with your health care provider. Document Revised: 08/05/2017 Document Reviewed: 08/05/2017 Elsevier Patient Education  [place] Instructions  Activity: Get  plenty of rest for the remainder of the day. A responsible individual must stay with you for 24 hours following the procedure.  For the next 24 hours, DO NOT: -Drive a car -Paediatric nurse -Drink alcoholic beverages -Take any medication unless instructed by your physician -Make any legal decisions or sign important papers.  Meals: Start with liquid foods such as gelatin or soup. Progress to regular foods as tolerated. Avoid greasy, spicy, heavy foods. If nausea and/or vomiting occur, drink only clear liquids until the nausea and/or vomiting subsides. Call your physician if vomiting continues.  Special Instructions/Symptoms: Your throat may feel dry or sore from the anesthesia or the breathing tube placed in your throat during surgery. If this causes discomfort, gargle with warm salt water. The discomfort should disappear within 24 hours.  If you had a scopolamine patch placed behind your ear for the management of post- operative nausea and/or vomiting:  1. The medication in the patch is effective for 72 hours, after which it should be removed.  Wrap patch in a tissue and discard in the trash. Wash hands thoroughly with soap and water. 2. You may remove the patch earlier than 72 hours if you experience unpleasant side effects which may include dry mouth, dizziness or visual disturbances. 3. Avoid touching the patch. Wash your hands with soap and water after contact with the patch.

## 2019-08-25 NOTE — Transfer of Care (Signed)
Immediate Anesthesia Transfer of Care Note  Patient: Gabriela Warner  Procedure(s) Performed: RIGHT BREAST BIOPSY (Right Breast)  Patient Location: PACU  Anesthesia Type:General  Level of Consciousness: awake, alert  and oriented  Airway & Oxygen Therapy: Patient Spontanous Breathing and Patient connected to face mask oxygen  Post-op Assessment: Report given to RN and Post -op Vital signs reviewed and stable  Post vital signs: Reviewed and stable  Last Vitals:  Vitals Value Taken Time  BP 127/97 08/25/19 0831  Temp 37 C 08/25/19 0831  Pulse 110 08/25/19 0835  Resp 17 08/25/19 0835  SpO2 100 % 08/25/19 0835  Vitals shown include unvalidated device data.  Last Pain:  Vitals:   08/25/19 2217  TempSrc: Oral  PainSc: 0-No pain         Complications: No complications documented.

## 2019-08-25 NOTE — Op Note (Signed)
Gabriela Warner  29-Dec-1995   08/25/2019    PCP:  Kristen Loader, FNP   Surgeon: Kaylyn Lim, MD, FACS  Asst:  none  Anes:  general  Preop Dx: Fibroadenoma of the right breast Postop Dx: 6cm x 4 cm fibroadenoma of the right lower inner quadrant of the breast  Procedure: Right breast excisional biopsy Location Surgery: [facility] Complications: none  EBL:   minimal cc  Drains: none  Description of Procedure:  The patient was taken to OR 7 .  After anesthesia was administered and the patient was prepped  with chloroprep  and a timeout was performed.  The palpable mass was marked in the holding area and was readily palpable in the OR.  0.5% lidocaine was injected into the skin.   A smaller curvilinear incision was made over the mass and carried down in the subcutaneous tissue.  The mass was well defined and was separated from the surrounding tissue with gentle finger dissection down to a stalk.  In order to deliver the specimen, I had to enlarge the incision gradually to get to a point where it could be delivered.   It was then excised from the stalk controlling the blood supply and then the stalk was oversewn with a 4-0 vicryl fig of 8.  The surrounding area was injected with 10 cc lidocaine.  The wound was closed with 4-0 vicryl and 5-0 moncryl subcuticularly.  Dermabond on the skin.  Path was sent for permanents.    The patient tolerated the procedure well and was taken to the PACU in stable condition.     Matt B. Hassell Done, [place], [facility], [place]

## 2019-08-25 NOTE — Interval H&P Note (Signed)
History and Physical Interval Note:  08/25/2019 7:21 AM  Gabriela Warner  has presented today for surgery, with the diagnosis of FIBROADENOMA RIGHT BREAST.  The various methods of treatment have been discussed with the patient and family. After consideration of risks, benefits and other options for treatment, the patient has consented to  Procedure(s): RIGHT BREAST BIOPSY (Right) as a surgical intervention.  The patient's history has been reviewed, patient examined, no change in status, stable for surgery.  I have reviewed the patient's chart and labs.  Questions were answered to the patient's satisfaction.     Pedro Earls

## 2019-08-25 NOTE — Anesthesia Procedure Notes (Signed)
Procedure Name: LMA Insertion Date/Time: 08/25/2019 7:37 AM Performed by: Lavonia Dana, CRNA Pre-anesthesia Checklist: Patient identified, Emergency Drugs available, Suction available and Patient being monitored Patient Re-evaluated:Patient Re-evaluated prior to induction Oxygen Delivery Method: Circle system utilized Preoxygenation: Pre-oxygenation with 100% oxygen Induction Type: IV induction Ventilation: Mask ventilation without difficulty LMA: LMA inserted LMA Size: 4.0 Number of attempts: 1 Airway Equipment and Method: Bite block Placement Confirmation: positive ETCO2 Tube secured with: Tape Dental Injury: Teeth and Oropharynx as per pre-operative assessment

## 2019-08-25 NOTE — Anesthesia Preprocedure Evaluation (Addendum)
Anesthesia Evaluation  Patient identified by MRN, date of birth, ID band Patient awake    Reviewed: Allergy & Precautions, H&P , NPO status , Patient's Chart, lab work & pertinent test results  Airway Mallampati: III  TM Distance: >3 FB Neck ROM: Full    Dental no notable dental hx. (+) Teeth Intact, Dental Advisory Given   Pulmonary neg pulmonary ROS,    Pulmonary exam normal breath sounds clear to auscultation       Cardiovascular negative cardio ROS   Rhythm:Regular Rate:Normal     Neuro/Psych Anxiety Depression negative neurological ROS     GI/Hepatic negative GI ROS, Neg liver ROS,   Endo/Other  negative endocrine ROS  Renal/GU negative Renal ROS  negative genitourinary   Musculoskeletal   Abdominal   Peds  Hematology negative hematology ROS (+)   Anesthesia Other Findings   Reproductive/Obstetrics negative OB ROS                          Anesthesia Physical Anesthesia Plan  ASA: II  Anesthesia Plan: General   Post-op Pain Management:    Induction: Intravenous  PONV Risk Score and Plan: 4 or greater and Ondansetron, Dexamethasone and Midazolam  Airway Management Planned: LMA  Additional Equipment:   Intra-op Plan:   Post-operative Plan: Extubation in OR  Informed Consent: I have reviewed the patients History and Physical, chart, labs and discussed the procedure including the risks, benefits and alternatives for the proposed anesthesia with the patient or authorized representative who has indicated his/her understanding and acceptance.     Dental advisory given  Plan Discussed with: CRNA  Anesthesia Plan Comments:         Anesthesia Quick Evaluation

## 2019-08-28 ENCOUNTER — Encounter (HOSPITAL_BASED_OUTPATIENT_CLINIC_OR_DEPARTMENT_OTHER): Payer: Self-pay | Admitting: Surgery

## 2019-08-28 LAB — SURGICAL PATHOLOGY

## 2022-05-27 DIAGNOSIS — Z6829 Body mass index (BMI) 29.0-29.9, adult: Secondary | ICD-10-CM | POA: Diagnosis not present

## 2022-05-27 DIAGNOSIS — Z01419 Encounter for gynecological examination (general) (routine) without abnormal findings: Secondary | ICD-10-CM | POA: Diagnosis not present

## 2022-09-08 DIAGNOSIS — R059 Cough, unspecified: Secondary | ICD-10-CM | POA: Diagnosis not present

## 2022-09-08 DIAGNOSIS — J029 Acute pharyngitis, unspecified: Secondary | ICD-10-CM | POA: Diagnosis not present

## 2022-09-08 DIAGNOSIS — Z6827 Body mass index (BMI) 27.0-27.9, adult: Secondary | ICD-10-CM | POA: Diagnosis not present

## 2023-01-25 DIAGNOSIS — R41840 Attention and concentration deficit: Secondary | ICD-10-CM | POA: Diagnosis not present

## 2023-01-25 DIAGNOSIS — Z5181 Encounter for therapeutic drug level monitoring: Secondary | ICD-10-CM | POA: Diagnosis not present

## 2023-01-25 DIAGNOSIS — F419 Anxiety disorder, unspecified: Secondary | ICD-10-CM | POA: Diagnosis not present

## 2023-01-25 DIAGNOSIS — F331 Major depressive disorder, recurrent, moderate: Secondary | ICD-10-CM | POA: Diagnosis not present

## 2023-02-17 DIAGNOSIS — F419 Anxiety disorder, unspecified: Secondary | ICD-10-CM | POA: Diagnosis not present

## 2023-02-17 DIAGNOSIS — F331 Major depressive disorder, recurrent, moderate: Secondary | ICD-10-CM | POA: Diagnosis not present

## 2023-02-22 DIAGNOSIS — F419 Anxiety disorder, unspecified: Secondary | ICD-10-CM | POA: Diagnosis not present

## 2023-02-22 DIAGNOSIS — F331 Major depressive disorder, recurrent, moderate: Secondary | ICD-10-CM | POA: Diagnosis not present

## 2023-02-22 DIAGNOSIS — R41840 Attention and concentration deficit: Secondary | ICD-10-CM | POA: Diagnosis not present

## 2023-04-02 DIAGNOSIS — B001 Herpesviral vesicular dermatitis: Secondary | ICD-10-CM | POA: Diagnosis not present

## 2023-04-02 DIAGNOSIS — F3341 Major depressive disorder, recurrent, in partial remission: Secondary | ICD-10-CM | POA: Diagnosis not present

## 2023-04-02 DIAGNOSIS — F902 Attention-deficit hyperactivity disorder, combined type: Secondary | ICD-10-CM | POA: Diagnosis not present

## 2023-04-02 DIAGNOSIS — F411 Generalized anxiety disorder: Secondary | ICD-10-CM | POA: Diagnosis not present

## 2023-06-02 DIAGNOSIS — Z6833 Body mass index (BMI) 33.0-33.9, adult: Secondary | ICD-10-CM | POA: Diagnosis not present

## 2023-06-02 DIAGNOSIS — Z01419 Encounter for gynecological examination (general) (routine) without abnormal findings: Secondary | ICD-10-CM | POA: Diagnosis not present

## 2023-06-02 DIAGNOSIS — Z124 Encounter for screening for malignant neoplasm of cervix: Secondary | ICD-10-CM | POA: Diagnosis not present

## 2023-06-15 DIAGNOSIS — R519 Headache, unspecified: Secondary | ICD-10-CM | POA: Diagnosis not present

## 2023-06-15 DIAGNOSIS — H53143 Visual discomfort, bilateral: Secondary | ICD-10-CM | POA: Diagnosis not present

## 2023-06-22 DIAGNOSIS — R87615 Unsatisfactory cytologic smear of cervix: Secondary | ICD-10-CM | POA: Diagnosis not present

## 2023-06-22 DIAGNOSIS — Z124 Encounter for screening for malignant neoplasm of cervix: Secondary | ICD-10-CM | POA: Diagnosis not present

## 2023-07-06 DIAGNOSIS — Z6833 Body mass index (BMI) 33.0-33.9, adult: Secondary | ICD-10-CM | POA: Diagnosis not present

## 2023-07-06 DIAGNOSIS — E669 Obesity, unspecified: Secondary | ICD-10-CM | POA: Diagnosis not present

## 2023-07-06 DIAGNOSIS — F3341 Major depressive disorder, recurrent, in partial remission: Secondary | ICD-10-CM | POA: Diagnosis not present

## 2024-01-12 DIAGNOSIS — F3341 Major depressive disorder, recurrent, in partial remission: Secondary | ICD-10-CM | POA: Diagnosis not present

## 2024-01-12 DIAGNOSIS — Z23 Encounter for immunization: Secondary | ICD-10-CM | POA: Diagnosis not present

## 2024-01-12 DIAGNOSIS — F411 Generalized anxiety disorder: Secondary | ICD-10-CM | POA: Diagnosis not present
